# Patient Record
Sex: Female | Born: 1995 | Race: Black or African American | Hispanic: No | Marital: Single | Smoking: Never smoker
Health system: Southern US, Community
[De-identification: ages and names within clinical notes are randomized; demographics above are authoritative.]

## PROBLEM LIST (undated history)

## (undated) ENCOUNTER — Inpatient Hospital Stay (HOSPITAL_COMMUNITY): Payer: Self-pay

## (undated) DIAGNOSIS — A749 Chlamydial infection, unspecified: Secondary | ICD-10-CM

---
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
## 2017-08-07 ENCOUNTER — Other Ambulatory Visit: Payer: Self-pay

## 2017-08-07 ENCOUNTER — Ambulatory Visit (HOSPITAL_COMMUNITY)
Admission: EM | Admit: 2017-08-07 | Discharge: 2017-08-07 | Disposition: A | Discharge status: Home / Self Care | Payer: Managed Care, Other (non HMO) | Attending: Family Medicine | Admitting: Family Medicine

## 2017-08-07 ENCOUNTER — Encounter (HOSPITAL_COMMUNITY): Payer: Self-pay | Admitting: Emergency Medicine

## 2017-08-07 DIAGNOSIS — Z3201 Encounter for pregnancy test, result positive: Secondary | ICD-10-CM

## 2017-08-07 DIAGNOSIS — Z113 Encounter for screening for infections with a predominantly sexual mode of transmission: Secondary | ICD-10-CM | POA: Insufficient documentation

## 2017-08-07 DIAGNOSIS — N898 Other specified noninflammatory disorders of vagina: Secondary | ICD-10-CM | POA: Insufficient documentation

## 2017-08-07 HISTORY — DX: Chlamydial infection, unspecified: A74.9

## 2017-08-07 LAB — POCT URINALYSIS DIP (DEVICE)
BILIRUBIN URINE: NEGATIVE
GLUCOSE, UA: NEGATIVE mg/dL
Hgb urine dipstick: NEGATIVE
KETONES UR: NEGATIVE mg/dL
Nitrite: NEGATIVE
Protein, ur: NEGATIVE mg/dL
Specific Gravity, Urine: 1.02 (ref 1.005–1.030)
Urobilinogen, UA: 0.2 mg/dL (ref 0.0–1.0)
pH: 7 (ref 5.0–8.0)

## 2017-08-07 LAB — POCT PREGNANCY, URINE: Preg Test, Ur: POSITIVE — AB

## 2017-08-07 LAB — HCG, QUANTITATIVE, PREGNANCY: HCG, BETA CHAIN, QUANT, S: 268 m[IU]/mL — AB (ref <5)

## 2017-08-07 NOTE — ED Provider Notes (Signed)
[facility]    CSN: [id number] Arrival date & time: 08/07/17  1037     History   Chief Complaint Chief Complaint  Patient presents with  . Vaginal Discharge    HPI Jan Fireman'China Schleyer is a 22 y.o. female.   22 year old female comes in for STD check.  States has had odor to her urine vaginal area for the past 3 days. Has also had some white discharge, watery. Denies other urinary symptoms such as frequency, dysuria, hematuria. Has had some low abdominal cramps, which she states is normal for her as she is about to start her cycle soon. Denies nausea, vomiting, diarrhea. Denies spotting, vaginal bleeding. Denies fever, chills, night sweats.  Sexually active with one female partner, occasional condom use, no birth control use.  LMP 07/12/2017.      Past Medical History:  Diagnosis Date  . Chlamydia     There are no active problems to display for this patient.   History reviewed. No pertinent surgical history.  OB History    No data available       Home Medications    Prior to Admission medications   Medication Sig Start Date End Date Taking? Authorizing Provider  naproxen (NAPROSYN) 500 MG tablet Take 1 tablet (500 mg total) by mouth 2 (two) times daily. Patient not taking: Reported on 08/07/2017 05/23/14   Viviano Simasobinson, Lauren, NP    Family History No family history on file.  Social History Social History   Tobacco Use  . Smoking status: Never Smoker  Substance Use Topics  . Alcohol use: No  . Drug use: Not on file     Allergies   Patient has no known allergies.   Review of Systems Review of Systems  Reason unable to perform ROS: See HPI as above.     Physical Exam Triage Vital Signs ED Triage Vitals [08/07/17 1154]  Enc Vitals Group     BP 98/65     Pulse Rate 69     Resp 18     Temp 98.9 F (37.2 C)     Temp src      SpO2 100 %     Weight      Height      Head Circumference      Peak Flow      Pain Score      Pain Loc      Pain  Edu?      Excl. in GC?    No data found.  Updated Vital Signs BP 98/65   Pulse 69   Temp 98.9 F (37.2 C)   Resp 18   LMP 07/12/2017   SpO2 100%    Physical Exam  Constitutional: She is oriented to person, place, and time. She appears well-developed and well-nourished. No distress.  HENT:  Head: Normocephalic and atraumatic.  Eyes: Conjunctivae are normal. Pupils are equal, round, and reactive to light.  Cardiovascular: Normal rate, regular rhythm and normal heart sounds. Exam reveals no gallop and no friction rub.  No murmur heard. Pulmonary/Chest: Effort normal and breath sounds normal. She has no wheezes. She has no rales.  Abdominal: Soft. Bowel sounds are normal. She exhibits no mass. There is no tenderness. There is no rebound, no guarding and no CVA tenderness.  Neurological: She is alert and oriented to person, place, and time.  Skin: Skin is warm and dry.  Psychiatric: She has a normal mood and affect. Her behavior is normal. Judgment normal.  UC Treatments / Results  Labs (all labs ordered are listed, but only abnormal results are displayed) Labs Reviewed  HCG, QUANTITATIVE, PREGNANCY - Abnormal; Notable for the following components:      Result Value   hCG, Beta Chain, Quant, S 268 (*)    All other components within normal limits  POCT URINALYSIS DIP (DEVICE) - Abnormal; Notable for the following components:   Leukocytes, UA SMALL (*)    All other components within normal limits  POCT PREGNANCY, URINE - Abnormal; Notable for the following components:   Preg Test, Ur POSITIVE (*)    All other components within normal limits  URINE CULTURE  URINE CYTOLOGY ANCILLARY ONLY    EKG  EKG Interpretation None       Radiology No results found.  Procedures Procedures (including critical care time)  Medications Ordered in UC Medications - No data to display   Initial Impression / Assessment and Plan / UC Course  I have reviewed the triage vital signs  and the nursing notes.  Pertinent labs & imaging results that were available during my care of the patient were reviewed by me and considered in my medical decision making (see chart for details).    Urine pregnancy positive.  HCG quantitative shows 268.  Urine with small leukocytes, will send for urine culture as patient without urinary symptoms.  Patient with intermittent abdominal cramping, without tenderness on palpation on exam today.  She is without spotting or vaginal bleeding.  Will have patient monitor for now.  Patient to take prenatal vitamins.  Cytology sent, patient will be contacted with any positive results that require additional treatment. Patient to refrain from sexual activity for the next 7 days. Return precautions given.  Follow-up with OB/GYN for further management of pregnancy.  Woman's health information provided.   Final Clinical Impressions(s) / UC Diagnoses   Final diagnoses:  Vaginal discharge  Positive urine pregnancy test    ED Discharge Orders    None       Belinda Fisher, PA-C 08/07/17 1426

## 2017-08-07 NOTE — ED Triage Notes (Signed)
Pt here to get STD check, starting Wednesday she stated her pee smells strong with some vaginal odor.

## 2017-08-07 NOTE — Discharge Instructions (Signed)
Urine pregnancy test positive. I have done a blood draw for definitive testing. Cytology sent, you will be contacted with any positive results that requires further treatment. Refrain from sexual activity for the next 7 days.  If experiencing abdominal pain, spotting, fever, nausea, vomiting[facility].

## 2017-08-08 ENCOUNTER — Telehealth (HOSPITAL_BASED_OUTPATIENT_CLINIC_OR_DEPARTMENT_OTHER): Payer: Self-pay | Admitting: Emergency Medicine

## 2017-08-08 LAB — URINE CULTURE: Culture: 10000 — AB

## 2017-08-10 ENCOUNTER — Telehealth: Payer: Self-pay | Admitting: Internal Medicine

## 2017-08-10 LAB — URINE CYTOLOGY ANCILLARY ONLY
CHLAMYDIA, DNA PROBE: POSITIVE — AB
Neisseria Gonorrhea: NEGATIVE
Trichomonas: NEGATIVE

## 2017-08-10 MED ORDER — AZITHROMYCIN 500 MG PO TABS: 1000.0000 mg | ORAL_TABLET | Freq: Once | ORAL | 0 refills | Status: AC

## 2017-08-10 NOTE — Telephone Encounter (Signed)
Clinical staff, please let patient and the health department know that test for chlamydia was positive.  Rx zithromax sent to the pharmacy of record, [facility] on [address] at [place].  Please refrain from sexual intercourse for 7 days to give the medicine time to work.  Sexual partners need to be notified and tested/treated.  Condoms may reduce risk of reinfection.  Please refer also to previous result note of 2/17.  Reche[facility] [phone] if symptoms are not improving.   LM

## 2017-08-12 ENCOUNTER — Telehealth (HOSPITAL_COMMUNITY): Payer: Self-pay | Admitting: *Deleted

## 2017-08-12 LAB — URINE CYTOLOGY ANCILLARY ONLY: Candida vaginitis: POSITIVE — AB

## 2017-08-12 NOTE — Telephone Encounter (Signed)
Results reviewed with patient. Importance of taking zithromax stressed to patient as she is pregnant. Patient encouraged to follow up with [facility] for further evaluation and maintenance of pregnancy.

## 2017-08-13 MED ORDER — TERCONAZOLE 0.4 % VA CREA: 1.0000 | TOPICAL_CREAM | Freq: Every day | VAGINAL | 0 refills | Status: AC

## 2017-08-13 NOTE — Telephone Encounter (Signed)
Clinical staff, please let patient know that test for candida (yeast) was positive.  Prescription for terconazole cream was sent to the pharmacy of record,  [facility] on [address] at [address] [place].   Test for gardnerella (bacterial vaginosis) was also positive.  This only needs to be treated if there are persistent symptoms, such as vaginal irritation/discharge.   If these symptoms are present, ok to send rx for metronidazole 500mg  bid x 7d #14 no refills or clindamycin 300mg  bid x 7d #14 no refills. [facility] are not improving.  Please also refer to result notes of 2/20, 2/18 re: positive chlamydia test and recommended tx.  LM

## 2017-09-04 ENCOUNTER — Encounter (HOSPITAL_COMMUNITY): Payer: Self-pay | Admitting: *Deleted

## 2017-09-04 ENCOUNTER — Inpatient Hospital Stay (HOSPITAL_COMMUNITY): Payer: Managed Care, Other (non HMO)

## 2017-09-04 ENCOUNTER — Inpatient Hospital Stay (HOSPITAL_COMMUNITY)
Admission: AD | Admit: 2017-09-04 | Discharge: 2017-09-04 | Disposition: A | Discharge status: Home / Self Care | Payer: Managed Care, Other (non HMO) | Source: Ambulatory Visit | Attending: Obstetrics and Gynecology | Admitting: Obstetrics and Gynecology

## 2017-09-04 DIAGNOSIS — O209 Hemorrhage in early pregnancy, unspecified: Secondary | ICD-10-CM | POA: Diagnosis present

## 2017-09-04 DIAGNOSIS — Z8619 Personal history of other infectious and parasitic diseases: Secondary | ICD-10-CM | POA: Diagnosis not present

## 2017-09-04 DIAGNOSIS — B373 Candidiasis of vulva and vagina: Secondary | ICD-10-CM

## 2017-09-04 DIAGNOSIS — N760 Acute vaginitis: Secondary | ICD-10-CM

## 2017-09-04 DIAGNOSIS — B3731 Acute candidiasis of vulva and vagina: Secondary | ICD-10-CM

## 2017-09-04 DIAGNOSIS — Z3201 Encounter for pregnancy test, result positive: Secondary | ICD-10-CM | POA: Diagnosis not present

## 2017-09-04 DIAGNOSIS — Z3A08 8 weeks gestation of pregnancy: Secondary | ICD-10-CM | POA: Diagnosis not present

## 2017-09-04 DIAGNOSIS — R109 Unspecified abdominal pain: Secondary | ICD-10-CM | POA: Diagnosis present

## 2017-09-04 DIAGNOSIS — O98811 Other maternal infectious and parasitic diseases complicating pregnancy, first trimester: Secondary | ICD-10-CM | POA: Diagnosis not present

## 2017-09-04 DIAGNOSIS — B9689 Other specified bacterial agents as the cause of diseases classified elsewhere: Secondary | ICD-10-CM

## 2017-09-04 LAB — URINALYSIS, ROUTINE W REFLEX MICROSCOPIC
BACTERIA UA: NONE SEEN
Bilirubin Urine: NEGATIVE
Glucose, UA: NEGATIVE mg/dL
KETONES UR: NEGATIVE mg/dL
Nitrite: NEGATIVE
Protein, ur: NEGATIVE mg/dL
Specific Gravity, Urine: 1.023 (ref 1.005–1.030)
pH: 6 (ref 5.0–8.0)

## 2017-09-04 LAB — HCG, QUANTITATIVE, PREGNANCY: hCG, Beta Chain, Quant, S: 122853 m[IU]/mL — ABNORMAL HIGH (ref <5)

## 2017-09-04 LAB — WET PREP, GENITAL
Sperm: NONE SEEN
Trich, Wet Prep: NONE SEEN

## 2017-09-04 LAB — CBC
HEMATOCRIT: 36.5 % (ref 36.0–46.0)
Hemoglobin: 11.9 g/dL — ABNORMAL LOW (ref 12.0–15.0)
MCH: 24.4 pg — ABNORMAL LOW (ref 26.0–34.0)
MCHC: 32.6 g/dL (ref 30.0–36.0)
MCV: 74.9 fL — ABNORMAL LOW (ref 78.0–100.0)
Platelets: 224 10*3/uL (ref 150–400)
RBC: 4.87 MIL/uL (ref 3.87–5.11)
RDW: 17.2 % — ABNORMAL HIGH (ref 11.5–15.5)
WBC: 7.8 10*3/uL (ref 4.0–10.5)

## 2017-09-04 LAB — HIV ANTIBODY (ROUTINE TESTING W REFLEX): HIV Screen 4th Generation wRfx: NONREACTIVE

## 2017-09-04 LAB — ABO/RH: ABO/RH(D): O POS

## 2017-09-04 LAB — POCT PREGNANCY, URINE: PREG TEST UR: POSITIVE — AB

## 2017-09-04 MED ORDER — PRENATAL VITAMIN 27-0.8 MG PO TABS: 1.0000 | ORAL_TABLET | Freq: Every day | ORAL | 12 refills | Status: AC

## 2017-09-04 NOTE — Discharge Instructions (Signed)
Bacterial Vaginosis °Bacterial vaginosis is an infection of the vagina. It happens when too many germs (bacteria) grow in the vagina. This infection puts you at risk for infections from sex (STIs). Treating this infection can lower your risk for some STIs. You should also treat this if you are pregnant. It can cause your baby to be born early. °Follow these instructions at home: °Medicines °· Take over-the-counter and prescription medicines only as told by your doctor. °· Take or use your antibiotic medicine as told by your doctor. Do not stop taking or using it even if you start to feel better. °General instructions °· If you your sexual partner is a woman, tell her that you have this infection. She needs to get treatment if she has symptoms. If you have a female partner, he does not need to be treated. °· During treatment: °? Avoid sex. °? Do not douche. °? Avoid alcohol as told. °? Avoid breastfeeding as told. °· Drink enough fluid to keep your pee (urine) clear or pale yellow. °· Keep your vagina and butt (rectum) clean. °? Wash the area with warm water every day. °? Wipe from front to back after you use the toilet. °· Keep all follow-up visits as told by your doctor. This is important. °Preventing this condition °· Do not douche. °· Use only warm water to wash around your vagina. °· Use protection when you have sex. This includes: °? Latex condoms. °? Dental dams. °· Limit how many people you have sex with. It is best to only have sex with the same person (be monogamous). °· Get tested for STIs. Have your partner get tested. °· Wear underwear that is cotton or lined with cotton. °· Avoid tight pants and pantyhose. This is most important in summer. °· Do not use any products that have nicotine or tobacco in them. These include cigarettes and e-cigarettes. If you need help quitting, ask your doctor. °· Do not use illegal drugs. °· Limit how much alcohol you drink. °Contact a doctor if: °· Your symptoms do not get  better, even after you are treated. °· You have more discharge or pain when you pee (urinate). °· You have a fever. °· You have pain in your belly (abdomen). °· You have pain with sex. °· Your bleed from your vagina between periods. °Summary °· This infection happens when too many germs (bacteria) grow in the vagina. °· Treating this condition can lower your risk for some infections from sex (STIs). °· You should also treat this if you are pregnant. It can cause early (premature) birth. °· Do not stop taking or using your antibiotic medicine even if you start to feel better. °This information is not intended to replace advice given to you by your health care provider. Make sure you discuss any questions you have with your health care provider. °Document Released: 03/18/2008 Document Revised: 02/23/2016 Document Reviewed: 02/23/2016 °Elsevier Interactive Patient Education © 2017 Elsevier Inc. °Vaginal Bleeding During Pregnancy, First Trimester °A small amount of bleeding (spotting) from the vagina is common in early pregnancy. Sometimes the bleeding is normal and is not a problem, and sometimes it is a sign of something serious. Be sure to tell your doctor about any bleeding from your vagina right away. °Follow these instructions at home: °· Watch your condition for any changes. °· Follow your doctor's instructions about how active you can be. °· If you are on bed rest: °? You may need to stay in bed and only get up   up to use the bathroom. ? You may be allowed to do some activities. ? If you need help, make plans for someone to help you.  Write down: ? The number of pads you use each day. ? How often you change pads. ? How soaked (saturated) your pads are.  Do not use tampons.  Do not douche.  Do not have sex or orgasms until your doctor says it is okay.  If you pass any tissue from your vagina, save the tissue so you can show it to your doctor.  Only take medicines as told by your doctor.  Do not  take aspirin because it can make you bleed.  Keep all follow-up visits as told by your doctor. Contact a doctor if:  You bleed from your vagina.  You have cramps.  You have labor pains.  You have a fever that does not go away after you take medicine. Get help right away if:  You have very bad cramps in your back or belly (abdomen).  You pass large clots or tissue from your vagina.  You bleed more.  You feel light-headed or weak.  You pass out (faint).  You have chills.  You are leaking fluid or have a gush of fluid from your vagina.  You pass out while pooping (having a bowel movement). This information is not intended to replace advice given to you by your health care provider. Make sure you discuss any questions you have with your health care provider. Document Released: 10/24/2013 Document Revised: 11/15/2015 Document Reviewed: 02/14/2013 Elsevier Interactive Patient Education  2018 [employer]  Vaginal Yeast infection, Adult Vaginal yeast infection is a condition that causes soreness, swelling, and redness (inflammation) of the vagina. It also causes vaginal discharge. This is a common condition. Some women get this infection frequently. What are the causes? This condition is caused by a change in the normal balance of the yeast (candida) and bacteria that live in the vagina. This change causes an overgrowth of yeast, which causes the inflammation. What increases the risk? This condition is more likely to develop in:  Women who take antibiotic medicines.  Women who have diabetes.  Women who take birth control pills.  Women who are pregnant.  Women who douche often.  Women who have a weak defense (immune) system.  Women who have been taking steroid medicines for a long time.  Women who frequently wear tight clothing.  What are the signs or symptoms? Symptoms of this condition include:  White, thick vaginal discharge.  Swelling, itching, redness, and  irritation of the vagina. The lips of the vagina (vulva) may be affected as well.  Pain or a burning feeling while urinating.  Pain during sex.  How is this diagnosed? This condition is diagnosed with a medical history and physical exam. This will include a pelvic exam. Your health care provider will examine a sample of your vaginal discharge under a microscope. Your health care provider may send this sample for testing to confirm the diagnosis. How is this treated? This condition is treated with medicine. Medicines may be over-the-counter or prescription. You may be told to use one or more of the following:  Medicine that is taken orally.  Medicine that is applied as a cream.  Medicine that is inserted directly into the vagina (suppository).  Follow these instructions at home:  Take or apply over-the-counter and prescription medicines only as told by your health care provider.  Do not have sex until your health care  provider has approved. Tell your sex partner that you have a yeast infection. That person should go to his or her health care provider if he or she develops symptoms.  Do not wear tight clothes, such as pantyhose or tight pants.  Avoid using tampons until your health care provider approves.  Eat more yogurt. This may help to keep your yeast infection from returning.  Try taking a sitz bath to help with discomfort. This is a warm water bath that is taken while you are sitting down. The water should only come up to your hips and should cover your buttocks. Do this 3-4 times per day or as told by your health care provider.  Do not douche.  Wear breathable, cotton underwear.  If you have diabetes, keep your blood sugar levels under control. Contact a health care provider if:  You have a fever.  Your symptoms go away and then return.  Your symptoms do not get better with treatment.  Your symptoms get worse.  You have new symptoms.  You develop blisters in or  around your vagina.  You have blood coming from your vagina and it is not your menstrual period.  You develop pain in your abdomen. This information is not intended to replace advice given to you by your health care provider. Make sure you discuss any questions you have with your health care provider. Document Released: 03/19/2005 Document Revised: 11/21/2015 Document Reviewed: 12/11/2014 Elsevier Interactive Patient Education  2018 [employer].

## 2017-09-04 NOTE — MAU Provider Note (Signed)
Chief Complaint: Abdominal Pain and Vaginal Bleeding   First Provider Initiated Contact with Patient 09/04/17 0454        SUBJECTIVE HPI: Tasha Hudson is a 22 y.o. No obstetric history on file. at [redacted]w[redacted]d by LMP who presents to maternity admissions reporting bleeding and cramping. . She denies vaginal itching/burning, urinary symptoms, h/a, dizziness, n/v, or fever/chills.     Abdominal Pain  This is a new problem. The current episode started in the past 7 days. The problem occurs intermittently. The problem has been unchanged. The pain is located in the suprapubic region. The quality of the pain is cramping. The abdominal pain does not radiate. Pertinent negatives include no constipation, diarrhea, dysuria, fever, frequency, headaches, myalgias, nausea or vomiting. Nothing aggravates the pain. The pain is relieved by nothing.  Vaginal Bleeding  The patient's primary symptoms include pelvic pain and vaginal bleeding. The patient's pertinent negatives include no genital itching, genital lesions or genital odor. The current episode started in the past 7 days. The problem has been unchanged. The pain is mild. She is pregnant. Associated symptoms include abdominal pain. Pertinent negatives include no constipation, diarrhea, dysuria, fever, frequency, headaches, nausea or vomiting. The vaginal discharge was bloody. The vaginal bleeding is heavier than menses. She has been passing clots. She has not been passing tissue. Nothing aggravates the symptoms. She has tried nothing for the symptoms.   RN Note: Vaginal bleeding and cramping for the past week.  Bleeding as much as a normal period.  Not passing any clots or having any other vaginal discharge.  Scheduled for a TAB on Monday.      Past Medical History:  Diagnosis Date  . Chlamydia    No past surgical history on file. Social History   Socioeconomic History  . Marital status: Single    Spouse name: Not on file  . Number of children: Not on  file  . Years of education: Not on file  . Highest education level: Not on file  Social Needs  . Financial resource strain: Not on file  . Food insecurity - worry: Not on file  . Food insecurity - inability: Not on file  . Transportation needs - medical: Not on file  . Transportation needs - non-medical: Not on file  Occupational History  . Not on file  Tobacco Use  . Smoking status: Never Smoker  Substance and Sexual Activity  . Alcohol use: No  . Drug use: Not on file  . Sexual activity: Not on file  Other Topics Concern  . Not on file  Social History Narrative  . Not on file   No current facility-administered medications on file prior to encounter.    Current Outpatient Medications on File Prior to Encounter  Medication Sig Dispense Refill  . naproxen (NAPROSYN) 500 MG tablet Take 1 tablet (500 mg total) by mouth 2 (two) times daily. (Patient not taking: Reported on 08/07/2017) 30 tablet 0  . terconazole (TERAZOL 7) 0.4 % vaginal cream Place 1 applicator vaginally at bedtime. 45 g 0   No Known Allergies  I have reviewed patient's Past Medical Hx, Surgical Hx, Family Hx, Social Hx, medications and allergies.   ROS:  Review of Systems  Constitutional: Negative for fever.  Gastrointestinal: Positive for abdominal pain. Negative for constipation, diarrhea, nausea and vomiting.  Genitourinary: Positive for pelvic pain and vaginal bleeding. Negative for dysuria and frequency.  Musculoskeletal: Negative for myalgias.  Neurological: Negative for headaches.   Review of Systems  Other systems  negative   Physical Exam  Physical Exam Patient Vitals for the past 24 hrs:  BP Temp Pulse Resp Height Weight  09/04/17 0440 101/78 98.9 F (37.2 C) 72 19 5\' 5"  (1.651 m) 139 lb 12 oz (63.4 kg)   Constitutional: Well-developed, well-nourished female in no acute distress.  Cardiovascular: normal rate Respiratory: normal effort GI: Abd soft, non-tender. Pos BS x 4 MS: Extremities  nontender, no edema, normal ROM Neurologic: Alert and oriented x 4.  GU: Neg CVAT.  PELVIC EXAM: Cervix pink, visually closed, without lesion, moderate bloody discharge, vaginal walls and external genitalia normal Bimanual exam: Cervix 0/long/high, firm, anterior, neg CMT, uterus slightly tender, nonenlarged, adnexa without tenderness, enlargement, or mass   LAB RESULTS Results for orders placed or performed during the hospital encounter of 09/04/17 (from the past 24 hour(s))  Urinalysis, Routine w reflex microscopic     Status: Abnormal   Collection Time: 09/04/17  4:48 AM  Result Value Ref Range   Color, Urine YELLOW YELLOW   APPearance HAZY (A) CLEAR   Specific Gravity, Urine 1.023 1.005 - 1.030   pH 6.0 5.0 - 8.0   Glucose, UA NEGATIVE NEGATIVE mg/dL   Hgb urine dipstick LARGE (A) NEGATIVE   Bilirubin Urine NEGATIVE NEGATIVE   Ketones, ur NEGATIVE NEGATIVE mg/dL   Protein, ur NEGATIVE NEGATIVE mg/dL   Nitrite NEGATIVE NEGATIVE   Leukocytes, UA TRACE (A) NEGATIVE   RBC / HPF 0-5 0 - 5 RBC/hpf   WBC, UA 0-5 0 - 5 WBC/hpf   Bacteria, UA NONE SEEN NONE SEEN   Squamous Epithelial / LPF 6-30 (A) NONE SEEN   Mucus PRESENT   Pregnancy, urine POC     Status: Abnormal   Collection Time: 09/04/17  4:50 AM  Result Value Ref Range   Preg Test, Ur POSITIVE (A) NEGATIVE  CBC     Status: Abnormal   Collection Time: 09/04/17  5:08 AM  Result Value Ref Range   WBC 7.8 4.0 - 10.5 K/uL   RBC 4.87 3.87 - 5.11 MIL/uL   Hemoglobin 11.9 (L) 12.0 - 15.0 g/dL   HCT 40.9 81.1 - 91.4 %   MCV 74.9 (L) 78.0 - 100.0 fL   MCH 24.4 (L) 26.0 - 34.0 pg   MCHC 32.6 30.0 - 36.0 g/dL   RDW 78.2 (H) 95.6 - 21.3 %   Platelets 224 150 - 400 K/uL  hCG, quantitative, pregnancy     Status: Abnormal   Collection Time: 09/04/17  5:08 AM  Result Value Ref Range   hCG, Beta Chain, Quant, S 122,853 (H) <5 mIU/mL  ABO/Rh     Status: None (Preliminary result)   Collection Time: 09/04/17  5:08 AM  Result Value  Ref Range   ABO/RH(D)      O POS Performed at [facility], [address] [place], [place] [address]   Wet prep, genital     Status: Abnormal   Collection Time: 09/04/17  5:12 AM  Result Value Ref Range   Yeast Wet Prep HPF POC PRESENT (A) NONE SEEN   Trich, Wet Prep NONE SEEN NONE SEEN   Clue Cells Wet Prep HPF POC PRESENT (A) NONE SEEN   WBC, Wet Prep HPF POC FEW (A) NONE SEEN   Sperm NONE SEEN        IMAGING No results found.  MAU Management/MDM: Ordered usual first trimester r/o ectopic labs.   Pelvic exam and cultures done Will check baseline Ultrasound to rule out ectopic.  This bleeding/pain can represent a normal pregnancy with bleeding, spontaneous abortion or even an ectopic which can be life-threatening.  The process as listed above helps to determine which of these is present.  Reviewed normal US with live fetus   Patient seems pleased.  Requests prenatal vitamins. Has Rx for Terazol and Flagyl from last ER visit, but has not taken them  ASSESSMENT Single live IUP at 599w2d Bleeding in first trimester, [facility]  PLAN Discharge home Has Rx for yeast and BV Encouraged to seek prenatal care  Pt stable at time of discharge. Encouraged to return here or to other Urgent Care/ED if she develops worsening of symptoms, increase in pain, fever, or other concerning symptoms.    Wynelle BourgeoisMarie Marybell Robards CNM, MSN Certified Nurse-Midwife 09/04/2017  4:55 AM

## 2017-09-04 NOTE — MAU Note (Signed)
Vaginal bleeding and cramping for the past week.  Bleeding as much as a normal period.  Not passing any clots or having any other vaginal discharge.  Scheduled for a TAB on Monday.

## 2017-09-07 LAB — GC/CHLAMYDIA PROBE AMP (~~LOC~~) NOT AT ARMC
Chlamydia: NEGATIVE
Neisseria Gonorrhea: NEGATIVE

## 2017-11-05 ENCOUNTER — Ambulatory Visit (HOSPITAL_COMMUNITY)
Admission: EM | Admit: 2017-11-05 | Discharge: 2017-11-05 | Disposition: A | Discharge status: Home / Self Care | Payer: Self-pay | Attending: Family Medicine | Admitting: Family Medicine

## 2017-11-05 ENCOUNTER — Other Ambulatory Visit: Payer: Self-pay

## 2017-11-05 ENCOUNTER — Encounter (HOSPITAL_COMMUNITY): Payer: Self-pay | Admitting: Emergency Medicine

## 2017-11-05 DIAGNOSIS — N898 Other specified noninflammatory disorders of vagina: Secondary | ICD-10-CM | POA: Insufficient documentation

## 2017-11-05 DIAGNOSIS — R102 Pelvic and perineal pain: Secondary | ICD-10-CM | POA: Insufficient documentation

## 2017-11-05 DIAGNOSIS — Z791 Long term (current) use of non-steroidal anti-inflammatories (NSAID): Secondary | ICD-10-CM | POA: Insufficient documentation

## 2017-11-05 DIAGNOSIS — Z79899 Other long term (current) drug therapy: Secondary | ICD-10-CM | POA: Insufficient documentation

## 2017-11-05 LAB — POCT PREGNANCY, URINE: PREG TEST UR: NEGATIVE

## 2017-11-05 LAB — POCT URINALYSIS DIP (DEVICE)
Bilirubin Urine: NEGATIVE
Glucose, UA: NEGATIVE mg/dL
KETONES UR: NEGATIVE mg/dL
Leukocytes, UA: NEGATIVE
Nitrite: NEGATIVE
PH: 7.5 (ref 5.0–8.0)
Protein, ur: NEGATIVE mg/dL
Specific Gravity, Urine: 1.015 (ref 1.005–1.030)
Urobilinogen, UA: 0.2 mg/dL (ref 0.0–1.0)

## 2017-11-05 MED ORDER — IBUPROFEN 800 MG PO TABS
800.0000 mg | ORAL_TABLET | Freq: Once | ORAL | Status: AC
  Administered 2017-11-05: 800 mg via ORAL

## 2017-11-05 MED ORDER — IBUPROFEN 800 MG PO TABS: 800.0000 mg | ORAL_TABLET | Freq: Three times a day (TID) | ORAL | 0 refills | Status: AC

## 2017-11-05 MED ORDER — IBUPROFEN 800 MG PO TABS
ORAL_TABLET | ORAL | Status: AC
  Filled 2017-11-05: qty 1

## 2017-11-05 NOTE — ED Triage Notes (Addendum)
Pt reports suprapubic pain x2 weeks.  She also reports that when she "pees it doesn't feel normal".  Pt reports having an abortion one month ago.

## 2017-11-05 NOTE — Discharge Instructions (Signed)
Drink plenty of fluids. Ibuprofen as needed for pain. We are testing your urine and blood for infection and STD's, will call with any positive findings. Please follow up with your OB/Gyn. If develop fevers, worsening pain, increased bleeding or otherwise worsening please return sooner or go to Er.

## 2017-11-05 NOTE — ED Provider Notes (Signed)
[facility]    CSN: [id number] Arrival date & time: 11/05/17  1320     History   Chief Complaint Chief Complaint  Patient presents with  . Abdominal Pain    lower    HPI Tasha Hudson is a 22 y.o. female.   Tasha presents with complaints of low abdominal pain which has been ongoing for the past two weeks. Today felt sharp. Started her period yesterday. Has had to use two tampons since noon today. No exacerbating or relieving factors. No fevers. Has hat white odorous vaginal discharge. Without itching. She is sexually active and concerned about STD's although she states she has been using protection. Hx of std's. Elective abortion performed 4/9. Did not have pain following this. This is her first period since then. No previous pregnancies. Denies frequency or urgency with urination but states it feels "uncomfortable" with urination. Has not followed up with Ob since her procedure.    ROS per HPI.      Past Medical History:  Diagnosis Date  . Chlamydia     There are no active problems to display for this patient.   History reviewed. No pertinent surgical history.  OB History    Gravida  1   Para      Term      Preterm      AB  1   Living  0     SAB      TAB  1   Ectopic      Multiple      Live Births               Home Medications    Prior to Admission medications   Medication Sig Start Date End Date Taking? Authorizing Provider  Apple Cider Vinegar 188 MG CAPS Take by mouth.   Yes [provider]  ibuprofen (ADVIL,MOTRIN) 800 MG tablet Take 1 tablet (800 mg total) by mouth 3 (three) times daily. 11/05/17   Georgetta Haber, NP  Prenatal Vit-Fe Fumarate-FA (PRENATAL VITAMIN) 27-0.8 MG TABS Take 1 tablet by mouth daily. 09/04/17   Aviva Signs, CNM  terconazole (TERAZOL 7) 0.4 % vaginal cream Place 1 applicator vaginally at bedtime. 08/13/17   Isa Rankin, MD    Family History No family history on  file.  Social History Social History   Tobacco Use  . Smoking status: Never Smoker  . Smokeless tobacco: Never Used  Substance Use Topics  . Alcohol use: No  . Drug use: Yes    Types: Marijuana     Allergies   Patient has no known allergies.   Review of Systems Review of Systems   Physical Exam Triage Vital Signs ED Triage Vitals  Enc Vitals Group     BP 11/05/17 1352 112/73     Pulse Rate 11/05/17 1352 72     Resp --      Temp 11/05/17 1352 (!) 97.4 F (36.3 C)     Temp Source 11/05/17 1352 Oral     SpO2 11/05/17 1352 100 %     Weight --      Height --      Head Circumference --      Peak Flow --      Pain Score 11/05/17 1348 7     Pain Loc --      Pain Edu? --      Excl. in GC? --    No data found.  Updated Vital Signs BP 112/73 (  BP Location: Right Arm)   Pulse 72   Temp (!) 97.4 F (36.3 C) (Oral)   LMP 07/11/2017 (Exact Date)   SpO2 100%   Breastfeeding? No    Physical Exam  Constitutional: She is oriented to person, place, and time. She appears well-developed and well-nourished. No distress.  Cardiovascular: Normal rate, regular rhythm and normal heart sounds.  Pulmonary/Chest: Effort normal and breath sounds normal.  Abdominal: Soft. There is tenderness in the suprapubic area.  Genitourinary: Uterus is not tender. Cervix exhibits no motion tenderness and no friability. Right adnexum displays no tenderness. Left adnexum displays no tenderness. There is bleeding in the vagina.  Neurological: She is alert and oriented to person, place, and time.  Skin: Skin is warm and dry.     UC Treatments / Results  Labs (all labs ordered are listed, but only abnormal results are displayed) Labs Reviewed  POCT URINALYSIS DIP (DEVICE) - Abnormal; Notable for the following components:      Result Value   Hgb urine dipstick MODERATE (*)    All other components within normal limits  RPR  HIV ANTIBODY (ROUTINE TESTING)  POCT PREGNANCY, URINE  URINE  CYTOLOGY ANCILLARY ONLY    EKG None  Radiology No results found.  Procedures Procedures (including critical care time)  Medications Ordered in UC Medications  ibuprofen (ADVIL,MOTRIN) tablet 800 mg (has no administration in time range)    Initial Impression / Assessment and Plan / UC Course  I have reviewed the triage vital signs and the nursing notes.  Pertinent labs & imaging results that were available during my care of the patient were reviewed by me and considered in my medical decision making (see chart for details).     Negative urine pregnancy, without acute findings on urine dip. Urine cytology and HIV and RPR pending. Non specific abdominal and pelvic exam without red flag findings. Mild pelvic tenderness. Has not taken any medications for pain. Ibuprofen provided. Afebrile. Return precautions provided. Encouraged close follow up with ob/gyn as previously recommended. Patient verbalized understanding and agreeable to plan.    Final Clinical Impressions(s) / UC Diagnoses   Final diagnoses:  Pelvic pain in female     Discharge Instructions     Drink plenty of fluids. Ibuprofen as needed for pain. We are testing your urine and blood for infection and STD's, will call with any positive findings. Please follow up with your OB/Gyn. If develop fevers, worsening pain, increased bleeding or otherwise worsening please return sooner or go to Er.     ED Prescriptions    Medication Sig Dispense Auth. Provider   ibuprofen (ADVIL,MOTRIN) 800 MG tablet Take 1 tablet (800 mg total) by mouth 3 (three) times daily. 21 tablet Georgetta Haber, NP     Controlled Substance Prescriptions [place] Controlled Substance Registry consulted? Not Applicable   Georgetta Haber, NP 11/05/17 1451

## 2017-11-06 ENCOUNTER — Telehealth: Payer: Self-pay | Admitting: Medical

## 2017-11-06 LAB — HIV ANTIBODY (ROUTINE TESTING W REFLEX): HIV SCREEN 4TH GENERATION: NONREACTIVE

## 2017-11-06 LAB — URINE CYTOLOGY ANCILLARY ONLY
CHLAMYDIA, DNA PROBE: NEGATIVE
Neisseria Gonorrhea: NEGATIVE
Trichomonas: NEGATIVE

## 2017-11-06 LAB — RPR: RPR: NONREACTIVE

## 2017-11-06 NOTE — Telephone Encounter (Signed)
Called patient to make her an appointment. Patient stated she had moved to [place].

## 2017-11-09 ENCOUNTER — Telehealth (HOSPITAL_COMMUNITY): Payer: Self-pay

## 2017-11-09 NOTE — Telephone Encounter (Signed)
Negative STD screening and negative HIV, RPR tests.  Pt contacted and made aware. Verbalized understanding. Answered all questions.

## 2017-11-10 LAB — URINE CYTOLOGY ANCILLARY ONLY: CANDIDA VAGINITIS: NEGATIVE

## 2017-11-13 ENCOUNTER — Telehealth (HOSPITAL_COMMUNITY): Payer: Self-pay

## 2017-11-13 NOTE — Telephone Encounter (Signed)
Attempted to reach patient regarding Bacterial vaginosis is positive. This was not treated at the urgent care visit.  Patient did not answer and no voicemail has been set up.

## 2017-11-18 ENCOUNTER — Telehealth (HOSPITAL_COMMUNITY): Payer: Self-pay | Admitting: Emergency Medicine

## 2017-11-18 MED ORDER — METRONIDAZOLE 500 MG PO TABS: 500.0000 mg | ORAL_TABLET | Freq: Two times a day (BID) | ORAL | 0 refills | Status: AC

## 2020-02-10 IMAGING — US US OB COMP LESS 14 WK
2 series · 15 of 28 positions shown · non-contrast
Comparison: None.

CLINICAL DATA: Acute onset of vaginal bleeding. Light pelvic
cramping.

EXAM:
OBSTETRIC <14 WK ULTRASOUND
TECHNIQUE: Transabdominal ultrasound was performed for evaluation of the
gestation as well as the maternal uterus and adnexal regions.

[Series 1: us ob comp less 14 wk · 38 acquisitions, 14 frames shown (1 of 2)]
[im 1/38]
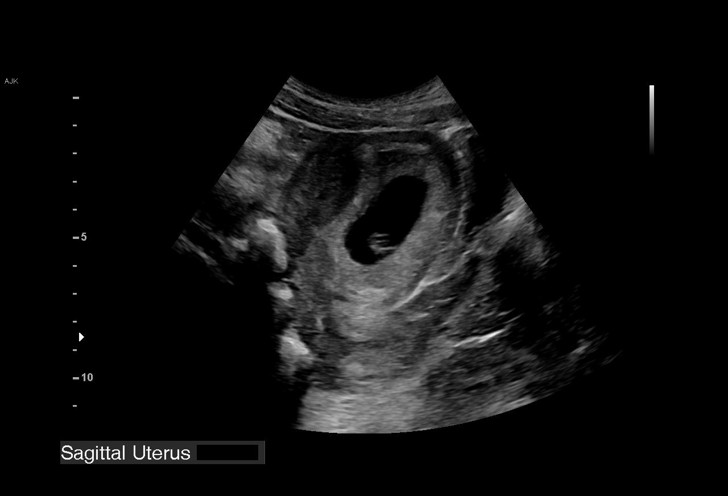
[im 3/38]
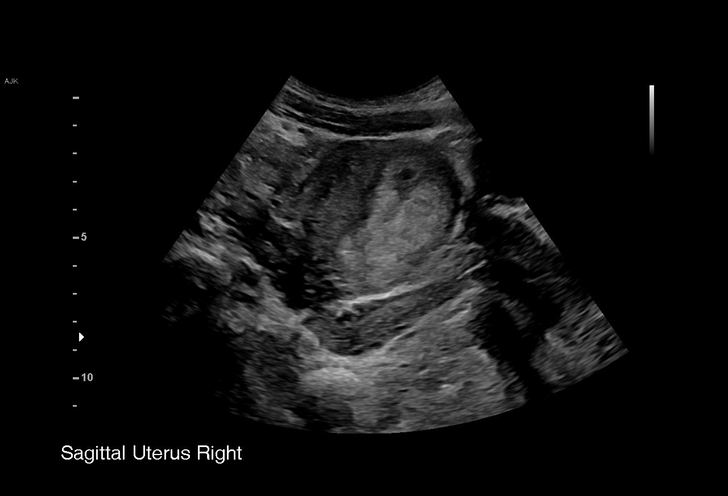
[im 6/38]
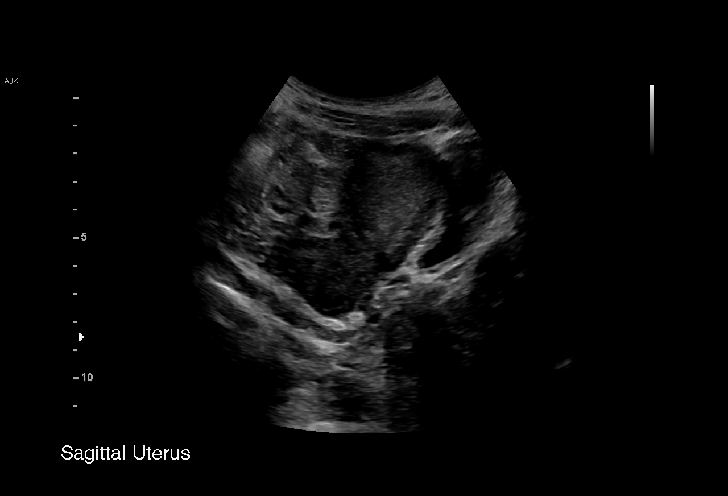
[im 9/38]
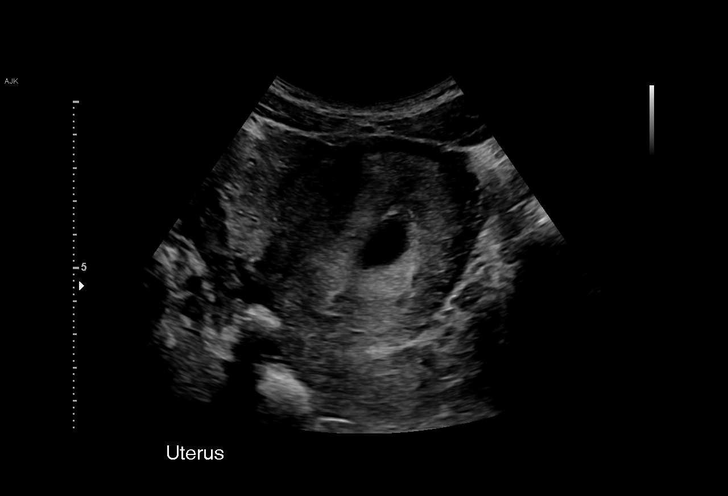
[im 12/38]
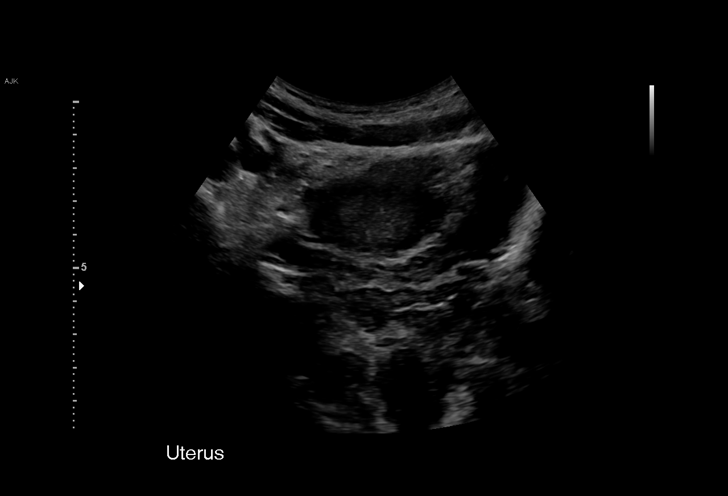
[im 15/38]
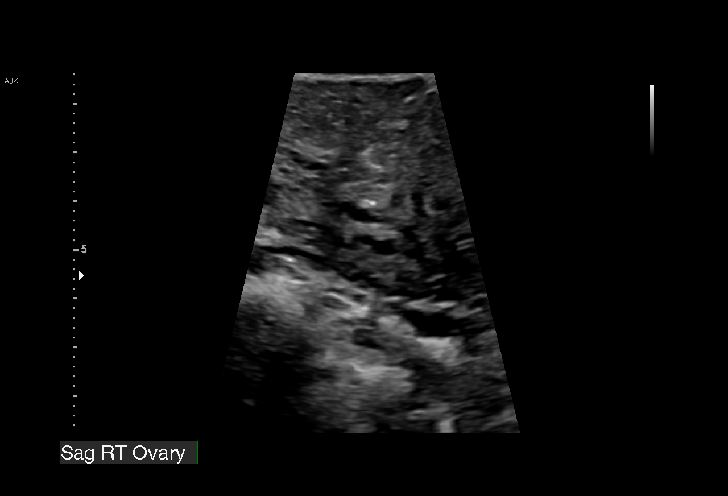
[im 18/38]
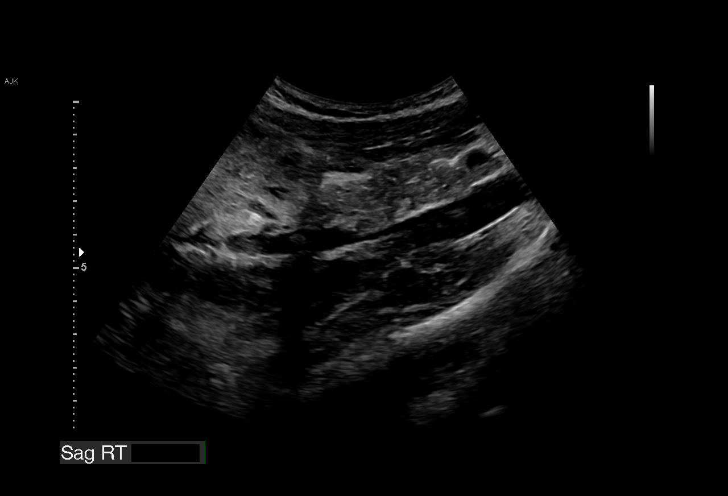
[im 21/38]
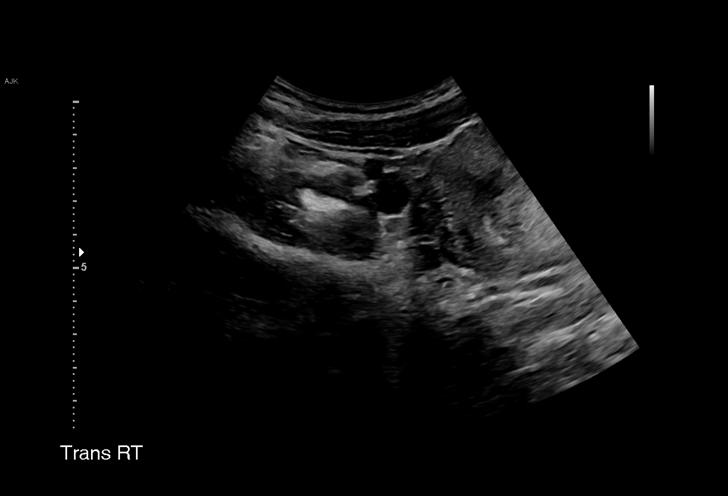
[im 23/38]
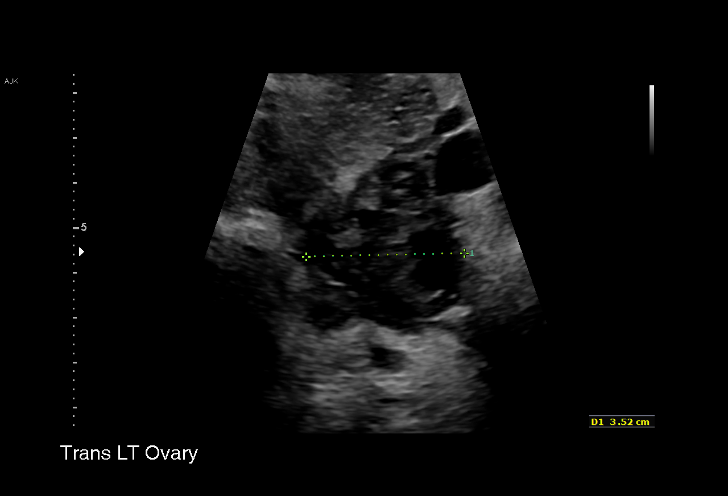
[im 26/38]
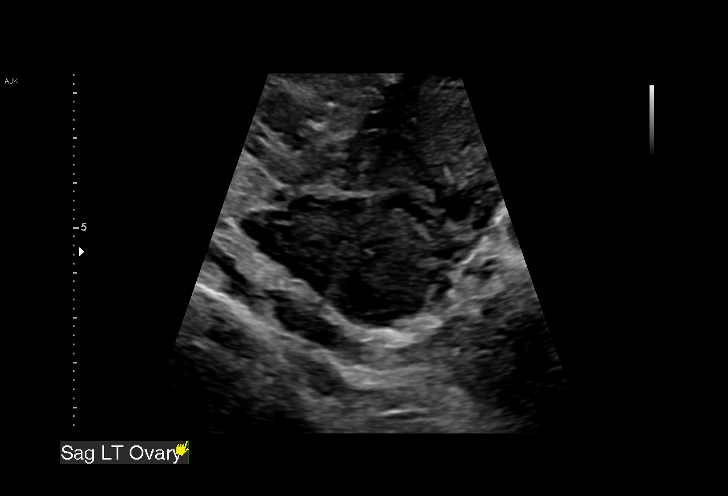
[im 29/38]
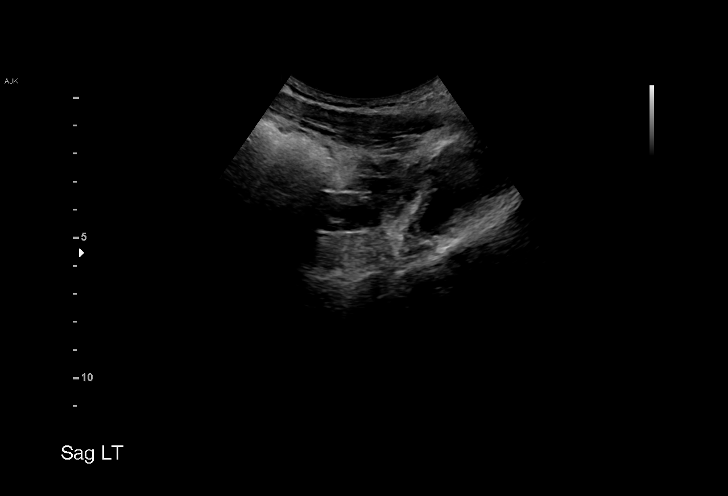
[im 32/38]
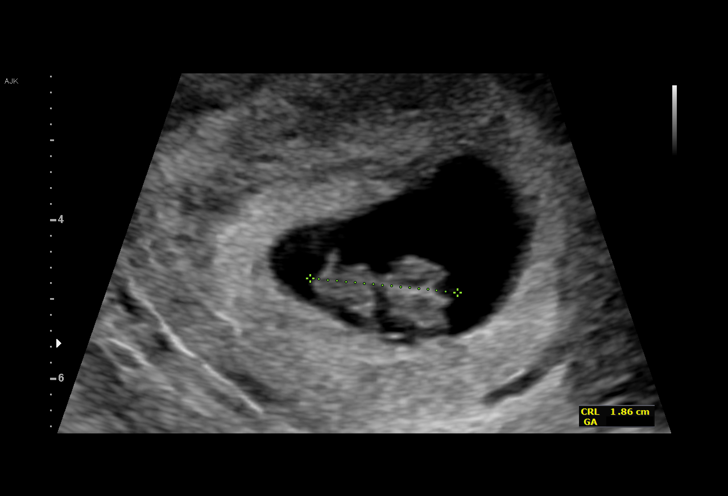
[im 35/38]
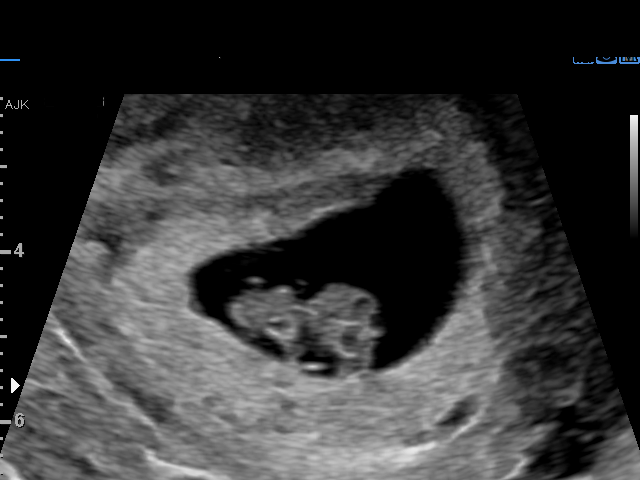
[im 38/38]
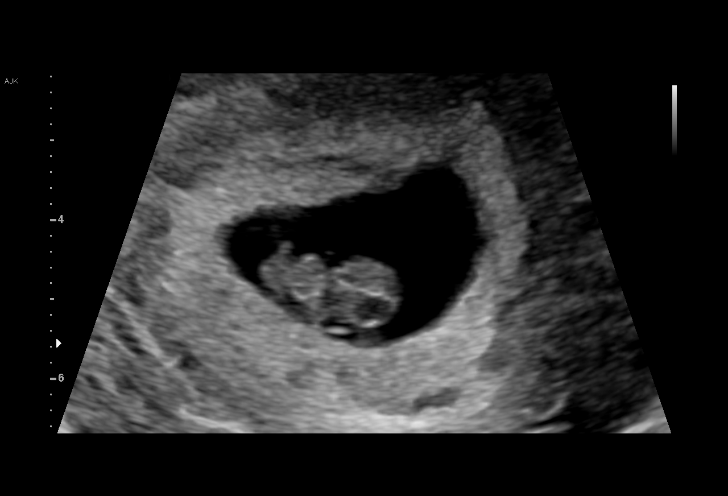

[Series 2: us ob comp less 14 wk · 1 of 3 slices shown (2 of 2)]
[im 3/3]
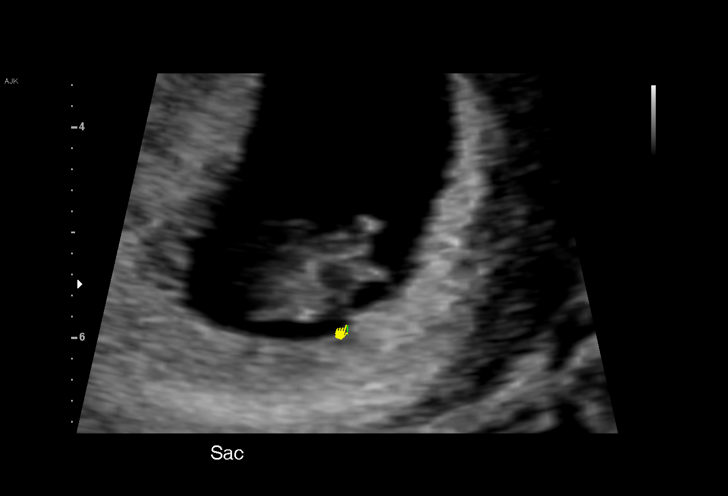

[15 of 28 positions shown; findings below may reference images not displayed]

FINDINGS: Intrauterine gestational sac: Single; visualized and normal in
shape.

Yolk sac:  Yes

Embryo:  Yes

Cardiac Activity: Yes

Heart Rate: 168 bpm

CRL: 18.7 mm   8 w 2 d                  US EDC: 04/14/2018

Subchorionic hemorrhage:  None visualized.

Maternal uterus/adnexae: The uterus is otherwise unremarkable in
appearance.

The ovaries are within normal limits. The right ovary measures 2.3 x
1.0 x 1.8 cm, while the left ovary measures 4.5 x 3.0 x 3.5 cm. No
suspicious adnexal masses are seen; there is no evidence for ovarian
torsion.

No free fluid is seen within the pelvic cul-de-sac.
IMPRESSION: Single live intrauterine pregnancy noted, with a crown-rump length
of 1.9 cm, corresponding to a gestational age of 8 weeks 2 days.
This matches the gestational age of 7 weeks 5 days by LMP,
reflecting an estimated date of delivery April 18, 2018.

## 2021-11-21 DIAGNOSIS — Z419 Encounter for procedure for purposes other than remedying health state, unspecified: Secondary | ICD-10-CM | POA: Diagnosis not present

## 2021-12-16 DIAGNOSIS — Z113 Encounter for screening for infections with a predominantly sexual mode of transmission: Secondary | ICD-10-CM | POA: Diagnosis not present

## 2021-12-21 DIAGNOSIS — Z419 Encounter for procedure for purposes other than remedying health state, unspecified: Secondary | ICD-10-CM | POA: Diagnosis not present

## 2022-01-14 DIAGNOSIS — Z708 Other sex counseling: Secondary | ICD-10-CM | POA: Diagnosis not present

## 2022-01-14 DIAGNOSIS — N760 Acute vaginitis: Secondary | ICD-10-CM | POA: Diagnosis not present

## 2022-01-21 DIAGNOSIS — Z419 Encounter for procedure for purposes other than remedying health state, unspecified: Secondary | ICD-10-CM | POA: Diagnosis not present

## 2022-02-21 DIAGNOSIS — Z419 Encounter for procedure for purposes other than remedying health state, unspecified: Secondary | ICD-10-CM | POA: Diagnosis not present

## 2022-03-23 DIAGNOSIS — Z419 Encounter for procedure for purposes other than remedying health state, unspecified: Secondary | ICD-10-CM | POA: Diagnosis not present

## 2022-04-07 DIAGNOSIS — Z708 Other sex counseling: Secondary | ICD-10-CM | POA: Diagnosis not present

## 2022-04-07 DIAGNOSIS — Z118 Encounter for screening for other infectious and parasitic diseases: Secondary | ICD-10-CM | POA: Diagnosis not present

## 2022-04-07 DIAGNOSIS — Z114 Encounter for screening for human immunodeficiency virus [HIV]: Secondary | ICD-10-CM | POA: Diagnosis not present

## 2022-04-07 DIAGNOSIS — Z202 Contact with and (suspected) exposure to infections with a predominantly sexual mode of transmission: Secondary | ICD-10-CM | POA: Diagnosis not present

## 2022-04-16 DIAGNOSIS — N760 Acute vaginitis: Secondary | ICD-10-CM | POA: Diagnosis not present

## 2022-04-23 DIAGNOSIS — Z419 Encounter for procedure for purposes other than remedying health state, unspecified: Secondary | ICD-10-CM | POA: Diagnosis not present

## 2022-05-23 DIAGNOSIS — Z419 Encounter for procedure for purposes other than remedying health state, unspecified: Secondary | ICD-10-CM | POA: Diagnosis not present

## 2022-06-18 DIAGNOSIS — B9689 Other specified bacterial agents as the cause of diseases classified elsewhere: Secondary | ICD-10-CM | POA: Diagnosis not present

## 2022-06-18 DIAGNOSIS — N898 Other specified noninflammatory disorders of vagina: Secondary | ICD-10-CM | POA: Diagnosis not present

## 2022-06-18 DIAGNOSIS — Z202 Contact with and (suspected) exposure to infections with a predominantly sexual mode of transmission: Secondary | ICD-10-CM | POA: Diagnosis not present

## 2022-06-18 DIAGNOSIS — Z789 Other specified health status: Secondary | ICD-10-CM | POA: Diagnosis not present

## 2022-06-23 DIAGNOSIS — Z419 Encounter for procedure for purposes other than remedying health state, unspecified: Secondary | ICD-10-CM | POA: Diagnosis not present

## 2022-07-21 DIAGNOSIS — Z1159 Encounter for screening for other viral diseases: Secondary | ICD-10-CM | POA: Diagnosis not present

## 2022-07-24 DIAGNOSIS — Z419 Encounter for procedure for purposes other than remedying health state, unspecified: Secondary | ICD-10-CM | POA: Diagnosis not present

## 2022-08-22 DIAGNOSIS — Z419 Encounter for procedure for purposes other than remedying health state, unspecified: Secondary | ICD-10-CM | POA: Diagnosis not present

## 2022-09-22 DIAGNOSIS — Z419 Encounter for procedure for purposes other than remedying health state, unspecified: Secondary | ICD-10-CM | POA: Diagnosis not present

## 2022-10-22 DIAGNOSIS — Z419 Encounter for procedure for purposes other than remedying health state, unspecified: Secondary | ICD-10-CM | POA: Diagnosis not present

## 2022-11-22 DIAGNOSIS — Z419 Encounter for procedure for purposes other than remedying health state, unspecified: Secondary | ICD-10-CM | POA: Diagnosis not present

## 2022-11-28 DIAGNOSIS — Z681 Body mass index (BMI) 19 or less, adult: Secondary | ICD-10-CM | POA: Diagnosis not present

## 2022-11-28 DIAGNOSIS — N898 Other specified noninflammatory disorders of vagina: Secondary | ICD-10-CM | POA: Diagnosis not present

## 2022-11-28 DIAGNOSIS — Z202 Contact with and (suspected) exposure to infections with a predominantly sexual mode of transmission: Secondary | ICD-10-CM | POA: Diagnosis not present

## 2022-11-28 DIAGNOSIS — Z30011 Encounter for initial prescription of contraceptive pills: Secondary | ICD-10-CM | POA: Diagnosis not present

## 2022-12-22 DIAGNOSIS — Z419 Encounter for procedure for purposes other than remedying health state, unspecified: Secondary | ICD-10-CM | POA: Diagnosis not present

## 2023-01-22 DIAGNOSIS — Z419 Encounter for procedure for purposes other than remedying health state, unspecified: Secondary | ICD-10-CM | POA: Diagnosis not present

## 2023-02-22 DIAGNOSIS — Z419 Encounter for procedure for purposes other than remedying health state, unspecified: Secondary | ICD-10-CM | POA: Diagnosis not present

## 2023-02-25 DIAGNOSIS — Z6824 Body mass index (BMI) 24.0-24.9, adult: Secondary | ICD-10-CM | POA: Diagnosis not present

## 2023-02-25 DIAGNOSIS — Z202 Contact with and (suspected) exposure to infections with a predominantly sexual mode of transmission: Secondary | ICD-10-CM | POA: Diagnosis not present

## 2023-02-25 DIAGNOSIS — N3000 Acute cystitis without hematuria: Secondary | ICD-10-CM | POA: Diagnosis not present

## 2023-02-25 DIAGNOSIS — R300 Dysuria: Secondary | ICD-10-CM | POA: Diagnosis not present

## 2023-03-10 DIAGNOSIS — N3000 Acute cystitis without hematuria: Secondary | ICD-10-CM | POA: Diagnosis not present

## 2023-03-10 DIAGNOSIS — Z6824 Body mass index (BMI) 24.0-24.9, adult: Secondary | ICD-10-CM | POA: Diagnosis not present

## 2023-03-24 DIAGNOSIS — Z419 Encounter for procedure for purposes other than remedying health state, unspecified: Secondary | ICD-10-CM | POA: Diagnosis not present

## 2023-04-24 DIAGNOSIS — Z419 Encounter for procedure for purposes other than remedying health state, unspecified: Secondary | ICD-10-CM | POA: Diagnosis not present

## 2023-05-22 DIAGNOSIS — Z3200 Encounter for pregnancy test, result unknown: Secondary | ICD-10-CM | POA: Diagnosis not present

## 2023-05-22 DIAGNOSIS — Z7251 High risk heterosexual behavior: Secondary | ICD-10-CM | POA: Diagnosis not present

## 2023-05-22 DIAGNOSIS — Z6824 Body mass index (BMI) 24.0-24.9, adult: Secondary | ICD-10-CM | POA: Diagnosis not present

## 2023-05-22 DIAGNOSIS — N898 Other specified noninflammatory disorders of vagina: Secondary | ICD-10-CM | POA: Diagnosis not present

## 2023-05-22 DIAGNOSIS — Z202 Contact with and (suspected) exposure to infections with a predominantly sexual mode of transmission: Secondary | ICD-10-CM | POA: Diagnosis not present

## 2023-05-24 DIAGNOSIS — Z419 Encounter for procedure for purposes other than remedying health state, unspecified: Secondary | ICD-10-CM | POA: Diagnosis not present

## 2023-06-07 DIAGNOSIS — Z708 Other sex counseling: Secondary | ICD-10-CM | POA: Diagnosis not present

## 2023-06-07 DIAGNOSIS — Z6824 Body mass index (BMI) 24.0-24.9, adult: Secondary | ICD-10-CM | POA: Diagnosis not present

## 2023-06-07 DIAGNOSIS — Z7251 High risk heterosexual behavior: Secondary | ICD-10-CM | POA: Diagnosis not present

## 2023-06-24 DIAGNOSIS — Z419 Encounter for procedure for purposes other than remedying health state, unspecified: Secondary | ICD-10-CM | POA: Diagnosis not present

## 2023-07-15 DIAGNOSIS — Z113 Encounter for screening for infections with a predominantly sexual mode of transmission: Secondary | ICD-10-CM | POA: Diagnosis not present

## 2023-07-15 DIAGNOSIS — Z708 Other sex counseling: Secondary | ICD-10-CM | POA: Diagnosis not present

## 2023-07-15 DIAGNOSIS — R300 Dysuria: Secondary | ICD-10-CM | POA: Diagnosis not present

## 2023-07-15 DIAGNOSIS — N3000 Acute cystitis without hematuria: Secondary | ICD-10-CM | POA: Diagnosis not present

## 2023-07-15 DIAGNOSIS — Z6824 Body mass index (BMI) 24.0-24.9, adult: Secondary | ICD-10-CM | POA: Diagnosis not present

## 2023-07-25 DIAGNOSIS — Z419 Encounter for procedure for purposes other than remedying health state, unspecified: Secondary | ICD-10-CM | POA: Diagnosis not present

## 2023-08-22 DIAGNOSIS — H1033 Unspecified acute conjunctivitis, bilateral: Secondary | ICD-10-CM | POA: Diagnosis not present

## 2023-08-22 DIAGNOSIS — Z419 Encounter for procedure for purposes other than remedying health state, unspecified: Secondary | ICD-10-CM | POA: Diagnosis not present

## 2023-09-30 DIAGNOSIS — Z202 Contact with and (suspected) exposure to infections with a predominantly sexual mode of transmission: Secondary | ICD-10-CM | POA: Diagnosis not present

## 2023-09-30 DIAGNOSIS — R350 Frequency of micturition: Secondary | ICD-10-CM | POA: Diagnosis not present

## 2023-09-30 DIAGNOSIS — Z681 Body mass index (BMI) 19 or less, adult: Secondary | ICD-10-CM | POA: Diagnosis not present

## 2023-10-03 DIAGNOSIS — Z419 Encounter for procedure for purposes other than remedying health state, unspecified: Secondary | ICD-10-CM | POA: Diagnosis not present

## 2023-10-21 DIAGNOSIS — W540XXA Bitten by dog, initial encounter: Secondary | ICD-10-CM | POA: Diagnosis not present

## 2023-10-21 DIAGNOSIS — S61236A Puncture wound without foreign body of right little finger without damage to nail, initial encounter: Secondary | ICD-10-CM | POA: Diagnosis not present

## 2023-11-02 DIAGNOSIS — Z419 Encounter for procedure for purposes other than remedying health state, unspecified: Secondary | ICD-10-CM | POA: Diagnosis not present

## 2023-11-06 DIAGNOSIS — Z681 Body mass index (BMI) 19 or less, adult: Secondary | ICD-10-CM | POA: Diagnosis not present

## 2023-11-06 DIAGNOSIS — B379 Candidiasis, unspecified: Secondary | ICD-10-CM | POA: Diagnosis not present

## 2023-11-06 DIAGNOSIS — Z708 Other sex counseling: Secondary | ICD-10-CM | POA: Diagnosis not present

## 2023-11-30 DIAGNOSIS — N760 Acute vaginitis: Secondary | ICD-10-CM | POA: Diagnosis not present

## 2023-11-30 DIAGNOSIS — B9689 Other specified bacterial agents as the cause of diseases classified elsewhere: Secondary | ICD-10-CM | POA: Diagnosis not present

## 2023-11-30 DIAGNOSIS — Z6824 Body mass index (BMI) 24.0-24.9, adult: Secondary | ICD-10-CM | POA: Diagnosis not present

## 2023-11-30 DIAGNOSIS — Z789 Other specified health status: Secondary | ICD-10-CM | POA: Diagnosis not present

## 2023-11-30 DIAGNOSIS — N898 Other specified noninflammatory disorders of vagina: Secondary | ICD-10-CM | POA: Diagnosis not present

## 2023-12-03 DIAGNOSIS — Z419 Encounter for procedure for purposes other than remedying health state, unspecified: Secondary | ICD-10-CM | POA: Diagnosis not present

## 2023-12-30 DIAGNOSIS — N760 Acute vaginitis: Secondary | ICD-10-CM | POA: Diagnosis not present

## 2023-12-30 DIAGNOSIS — Z681 Body mass index (BMI) 19 or less, adult: Secondary | ICD-10-CM | POA: Diagnosis not present

## 2024-01-02 DIAGNOSIS — Z419 Encounter for procedure for purposes other than remedying health state, unspecified: Secondary | ICD-10-CM | POA: Diagnosis not present

## 2024-01-09 DIAGNOSIS — Z113 Encounter for screening for infections with a predominantly sexual mode of transmission: Secondary | ICD-10-CM | POA: Diagnosis not present

## 2024-01-09 DIAGNOSIS — N761 Subacute and chronic vaginitis: Secondary | ICD-10-CM | POA: Diagnosis not present

## 2024-01-09 DIAGNOSIS — L731 Pseudofolliculitis barbae: Secondary | ICD-10-CM | POA: Diagnosis not present

## 2024-01-09 DIAGNOSIS — Z708 Other sex counseling: Secondary | ICD-10-CM | POA: Diagnosis not present

## 2024-01-09 DIAGNOSIS — Z681 Body mass index (BMI) 19 or less, adult: Secondary | ICD-10-CM | POA: Diagnosis not present

## 2024-02-02 DIAGNOSIS — Z419 Encounter for procedure for purposes other than remedying health state, unspecified: Secondary | ICD-10-CM | POA: Diagnosis not present

## 2024-03-04 DIAGNOSIS — Z419 Encounter for procedure for purposes other than remedying health state, unspecified: Secondary | ICD-10-CM | POA: Diagnosis not present

## 2024-04-21 DIAGNOSIS — N898 Other specified noninflammatory disorders of vagina: Secondary | ICD-10-CM | POA: Diagnosis not present

## 2024-04-21 DIAGNOSIS — Z202 Contact with and (suspected) exposure to infections with a predominantly sexual mode of transmission: Secondary | ICD-10-CM | POA: Diagnosis not present

## 2024-04-21 DIAGNOSIS — Z6824 Body mass index (BMI) 24.0-24.9, adult: Secondary | ICD-10-CM | POA: Diagnosis not present

## 2024-04-21 DIAGNOSIS — Z708 Other sex counseling: Secondary | ICD-10-CM | POA: Diagnosis not present

## 2024-06-05 DIAGNOSIS — Z3200 Encounter for pregnancy test, result unknown: Secondary | ICD-10-CM | POA: Diagnosis not present

## 2024-06-05 DIAGNOSIS — Z6824 Body mass index (BMI) 24.0-24.9, adult: Secondary | ICD-10-CM | POA: Diagnosis not present

## 2024-06-05 DIAGNOSIS — Z113 Encounter for screening for infections with a predominantly sexual mode of transmission: Secondary | ICD-10-CM | POA: Diagnosis not present

## 2024-06-05 DIAGNOSIS — N898 Other specified noninflammatory disorders of vagina: Secondary | ICD-10-CM | POA: Diagnosis not present

## 2024-06-05 DIAGNOSIS — Z1159 Encounter for screening for other viral diseases: Secondary | ICD-10-CM | POA: Diagnosis not present
